# Patient Record
Sex: Female | Born: 1995 | Hispanic: No | Marital: Single | Smoking: Never smoker
Health system: Southern US, Community
[De-identification: ages and names within clinical notes are randomized; demographics above are authoritative.]

---

## 2016-03-29 ENCOUNTER — Emergency Department (HOSPITAL_COMMUNITY): Payer: Self-pay

## 2016-03-29 ENCOUNTER — Emergency Department (HOSPITAL_COMMUNITY)
Admission: EM | Admit: 2016-03-29 | Discharge: 2016-03-29 | Disposition: A | Discharge status: Home / Self Care | Payer: Self-pay | Attending: Emergency Medicine | Admitting: Emergency Medicine

## 2016-03-29 ENCOUNTER — Encounter (HOSPITAL_COMMUNITY): Payer: Self-pay | Admitting: Emergency Medicine

## 2016-03-29 DIAGNOSIS — S42402A Unspecified fracture of lower end of left humerus, initial encounter for closed fracture: Secondary | ICD-10-CM

## 2016-03-29 DIAGNOSIS — T07XXXA Unspecified multiple injuries, initial encounter: Secondary | ICD-10-CM

## 2016-03-29 DIAGNOSIS — Y999 Unspecified external cause status: Secondary | ICD-10-CM | POA: Insufficient documentation

## 2016-03-29 DIAGNOSIS — Y939 Activity, unspecified: Secondary | ICD-10-CM | POA: Insufficient documentation

## 2016-03-29 DIAGNOSIS — M79602 Pain in left arm: Secondary | ICD-10-CM

## 2016-03-29 DIAGNOSIS — S60512A Abrasion of left hand, initial encounter: Secondary | ICD-10-CM | POA: Insufficient documentation

## 2016-03-29 DIAGNOSIS — S50812A Abrasion of left forearm, initial encounter: Secondary | ICD-10-CM | POA: Insufficient documentation

## 2016-03-29 DIAGNOSIS — S60812A Abrasion of left wrist, initial encounter: Secondary | ICD-10-CM | POA: Insufficient documentation

## 2016-03-29 DIAGNOSIS — Z23 Encounter for immunization: Secondary | ICD-10-CM | POA: Insufficient documentation

## 2016-03-29 DIAGNOSIS — Y92410 Unspecified street and highway as the place of occurrence of the external cause: Secondary | ICD-10-CM | POA: Insufficient documentation

## 2016-03-29 DIAGNOSIS — H1131 Conjunctival hemorrhage, right eye: Secondary | ICD-10-CM | POA: Insufficient documentation

## 2016-03-29 LAB — CBC WITH DIFFERENTIAL/PLATELET
EOS PCT: 1 %
HCT: 38.1 % (ref 36.0–46.0)
HEMOGLOBIN: 12.3 g/dL (ref 12.0–15.0)
Lymphocytes Relative: 37 %
MCH: 26.8 pg (ref 26.0–34.0)
MCHC: 32.3 g/dL (ref 30.0–36.0)
MCV: 83 fL (ref 78.0–100.0)
Monocytes Relative: 3 %
Neutrophils Relative %: 59 %
PLATELETS: 205 10*3/uL (ref 150–400)
RBC: 4.59 MIL/uL (ref 3.87–5.11)
RDW: 12.9 % (ref 11.5–15.5)
WBC: 18.1 10*3/uL — ABNORMAL HIGH (ref 4.0–10.5)

## 2016-03-29 LAB — COMPREHENSIVE METABOLIC PANEL
ALBUMIN: 3.9 g/dL (ref 3.5–5.0)
ALK PHOS: 64 U/L (ref 38–126)
ALT: 15 U/L (ref 14–54)
ANION GAP: 9 (ref 5–15)
AST: 25 U/L (ref 15–41)
BILIRUBIN TOTAL: 0.9 mg/dL (ref 0.3–1.2)
BUN: 10 mg/dL (ref 6–20)
CO2: 19 mmol/L — AB (ref 22–32)
Calcium: 9.2 mg/dL (ref 8.9–10.3)
Chloride: 109 mmol/L (ref 101–111)
Creatinine, Ser: 0.63 mg/dL (ref 0.44–1.00)
GFR calc Af Amer: >60 mL/min (ref >60)
GFR calc non Af Amer: >60 mL/min (ref >60)
GLUCOSE: 132 mg/dL — AB (ref 65–99)
POTASSIUM: 2.8 mmol/L — AB (ref 3.5–5.1)
SODIUM: 137 mmol/L (ref 135–145)
TOTAL PROTEIN: 6.6 g/dL (ref 6.5–8.1)

## 2016-03-29 LAB — I-STAT CHEM 8, ED
BUN: 11 mg/dL (ref 6–20)
CALCIUM ION: 1.19 mmol/L (ref 1.12–1.23)
CHLORIDE: 105 mmol/L (ref 101–111)
CREATININE: 0.6 mg/dL (ref 0.44–1.00)
GLUCOSE: 131 mg/dL — AB (ref 65–99)
HCT: 39 % (ref 36.0–46.0)
Hemoglobin: 13.3 g/dL (ref 12.0–15.0)
POTASSIUM: 2.8 mmol/L — AB (ref 3.5–5.1)
Sodium: 141 mmol/L (ref 135–145)
TCO2: 19 mmol/L (ref 0–100)

## 2016-03-29 LAB — I-STAT BETA HCG BLOOD, ED (MC, WL, AP ONLY): HCG, QUANTITATIVE: 36.1 m[IU]/mL — AB (ref <5)

## 2016-03-29 LAB — ETHANOL

## 2016-03-29 LAB — POC URINE PREG, ED: PREG TEST UR: NEGATIVE

## 2016-03-29 LAB — SAMPLE TO BLOOD BANK

## 2016-03-29 LAB — CDS SEROLOGY

## 2016-03-29 MED ORDER — ONDANSETRON HCL 4 MG/2ML IJ SOLN
4.0000 mg | Freq: Once | INTRAMUSCULAR | Status: AC
  Administered 2016-03-29: 4 mg via INTRAVENOUS
  Filled 2016-03-29: qty 2

## 2016-03-29 MED ORDER — PROMETHAZINE HCL 25 MG PO TABS
25.0000 mg | ORAL_TABLET | Freq: Once | ORAL | Status: AC
  Administered 2016-03-29: 25 mg via ORAL
  Filled 2016-03-29: qty 1

## 2016-03-29 MED ORDER — TETANUS-DIPHTH-ACELL PERTUSSIS 5-2.5-18.5 LF-MCG/0.5 IM SUSP
0.5000 mL | Freq: Once | INTRAMUSCULAR | Status: AC
  Administered 2016-03-29: 0.5 mL via INTRAMUSCULAR
  Filled 2016-03-29: qty 0.5

## 2016-03-29 MED ORDER — IOPAMIDOL (ISOVUE-300) INJECTION 61%
100.0000 mL | Freq: Once | INTRAVENOUS | Status: AC | PRN
Start: 2016-03-29 — End: 2016-03-29
  Administered 2016-03-29: 100 mL via INTRAVENOUS

## 2016-03-29 MED ORDER — HYDROCODONE-ACETAMINOPHEN 5-325 MG PO TABS: 1.0000 | ORAL_TABLET | ORAL | Status: AC | PRN

## 2016-03-29 MED ORDER — MORPHINE SULFATE (PF) 4 MG/ML IV SOLN
6.0000 mg | Freq: Once | INTRAVENOUS | Status: AC
  Administered 2016-03-29: 6 mg via INTRAVENOUS
  Filled 2016-03-29: qty 2

## 2016-03-29 MED ORDER — ONDANSETRON 4 MG PO TBDP
4.0000 mg | ORAL_TABLET | Freq: Once | ORAL | Status: AC
  Administered 2016-03-29: 4 mg via ORAL
  Filled 2016-03-29: qty 1

## 2016-03-29 MED ORDER — ONDANSETRON 8 MG PO TBDP: 8.0000 mg | ORAL_TABLET | Freq: Three times a day (TID) | ORAL | Status: AC | PRN

## 2016-03-29 NOTE — ED Provider Notes (Signed)
Vomiting, given phenergan. Vomited this up. Given ODT zofran and tolerated this better. Feels well and wants to go home  Brenda Rodriguez GoldstoPricilla Lovelessn, MD 03/29/16 [phone]

## 2016-03-29 NOTE — ED Notes (Signed)
Pt vomited, pill undigested evident in vomitus.  Dr. Criss AlvineGoldston made aware.
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
## 2016-03-29 NOTE — ED Notes (Signed)
Patient transported to X-ray 

## 2016-03-29 NOTE — Progress Notes (Signed)
Orthopedic Tech Progress Note Patient Details:  Brenda LampJessica Rodriguez 06-04-1996 [id number]  Ortho Devices Type of Ortho Device: Ace wrap, Arm sling, Long arm splint Ortho Device/Splint Location: lue Ortho Device/Splint Interventions: Application   Keiley Levey 03/29/2016, 9:01 AM

## 2016-03-29 NOTE — ED Notes (Signed)
MD at bedside. 
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
